# Patient Record
Sex: Male | Born: 1970 | Race: White | Hispanic: No | Marital: Married | Smoking: Never smoker
Health system: Southern US, Community
[De-identification: ages and names within clinical notes are randomized; demographics above are authoritative.]

## PROBLEM LIST (undated history)

## (undated) HISTORY — PX: APPENDECTOMY: SHX54

## (undated) HISTORY — PX: FRACTURE SURGERY: SHX138

---

## 2001-10-24 ENCOUNTER — Inpatient Hospital Stay (HOSPITAL_COMMUNITY): Admission: EM | Admit: 2001-10-24 | Discharge: 2001-10-30 | Payer: Self-pay | Admitting: Psychiatry

## 2005-02-26 ENCOUNTER — Emergency Department (HOSPITAL_COMMUNITY): Admission: EM | Admit: 2005-02-26 | Discharge: 2005-02-26 | Payer: Self-pay | Admitting: Emergency Medicine

## 2010-09-23 ENCOUNTER — Ambulatory Visit (HOSPITAL_BASED_OUTPATIENT_CLINIC_OR_DEPARTMENT_OTHER)
Admission: RE | Admit: 2010-09-23 | Discharge: 2010-09-23 | Disposition: A | Discharge status: Home / Self Care | Source: Ambulatory Visit | Attending: Orthopedic Surgery | Admitting: Orthopedic Surgery

## 2010-09-23 DIAGNOSIS — Z01812 Encounter for preprocedural laboratory examination: Secondary | ICD-10-CM | POA: Insufficient documentation

## 2010-09-23 DIAGNOSIS — IMO0002 Reserved for concepts with insufficient information to code with codable children: Secondary | ICD-10-CM | POA: Insufficient documentation

## 2010-09-23 LAB — POCT HEMOGLOBIN-HEMACUE: Hemoglobin: 17 g/dL (ref 13.0–17.0)

## 2010-10-08 ENCOUNTER — Emergency Department (HOSPITAL_COMMUNITY)

## 2010-10-08 ENCOUNTER — Emergency Department (HOSPITAL_COMMUNITY)
Admission: EM | Admit: 2010-10-08 | Discharge: 2010-10-08 | Disposition: A | Discharge status: Home / Self Care | Attending: Emergency Medicine | Admitting: Emergency Medicine

## 2010-10-08 DIAGNOSIS — R51 Headache: Secondary | ICD-10-CM | POA: Insufficient documentation

## 2010-10-08 DIAGNOSIS — R4182 Altered mental status, unspecified: Secondary | ICD-10-CM | POA: Insufficient documentation

## 2010-10-08 DIAGNOSIS — R112 Nausea with vomiting, unspecified: Secondary | ICD-10-CM | POA: Insufficient documentation

## 2010-10-08 DIAGNOSIS — R413 Other amnesia: Secondary | ICD-10-CM | POA: Insufficient documentation

## 2010-10-08 DIAGNOSIS — F4310 Post-traumatic stress disorder, unspecified: Secondary | ICD-10-CM | POA: Insufficient documentation

## 2010-10-08 LAB — COMPREHENSIVE METABOLIC PANEL
ALT: 89 U/L — ABNORMAL HIGH (ref 0–53)
AST: 81 U/L — ABNORMAL HIGH (ref 0–37)
Albumin: 4.7 g/dL (ref 3.5–5.2)
Alkaline Phosphatase: 73 U/L (ref 39–117)
Chloride: 99 mEq/L (ref 96–112)
Potassium: 4.7 mEq/L (ref 3.5–5.1)
Sodium: 134 mEq/L — ABNORMAL LOW (ref 135–145)
Total Bilirubin: 0.8 mg/dL (ref 0.3–1.2)
Total Protein: 8.2 g/dL (ref 6.0–8.3)

## 2010-10-08 LAB — DIFFERENTIAL
Basophils Absolute: 0 10*3/uL (ref 0.0–0.1)
Basophils Relative: 1 % (ref 0–1)
Eosinophils Absolute: 0.1 K/uL (ref 0.0–0.7)
Eosinophils Relative: 1 % (ref 0–5)
Lymphocytes Relative: 16 % (ref 12–46)
Lymphs Abs: 1 K/uL (ref 0.7–4.0)
Monocytes Absolute: 0.5 10*3/uL (ref 0.1–1.0)
Monocytes Relative: 7 % (ref 3–12)
Neutro Abs: 4.8 K/uL (ref 1.7–7.7)
Neutrophils Relative %: 75 % (ref 43–77)

## 2010-10-08 LAB — CBC
HCT: 46.6 % (ref 39.0–52.0)
Hemoglobin: 16.8 g/dL (ref 13.0–17.0)
MCH: 31.8 pg (ref 26.0–34.0)
MCHC: 36.1 g/dL — ABNORMAL HIGH (ref 30.0–36.0)
MCV: 88.1 fL (ref 78.0–100.0)
Platelets: 230 10*3/uL (ref 150–400)
RBC: 5.29 MIL/uL (ref 4.22–5.81)
RDW: 12.5 % (ref 11.5–15.5)
WBC: 6.4 10*3/uL (ref 4.0–10.5)

## 2010-10-08 LAB — COMPREHENSIVE METABOLIC PANEL WITH GFR
BUN: 8 mg/dL (ref 6–23)
CO2: 25 meq/L (ref 19–32)
Calcium: 10.4 mg/dL (ref 8.4–10.5)
Creatinine, Ser: 0.65 mg/dL (ref 0.50–1.35)
GFR calc Af Amer: >60 mL/min (ref >60)
GFR calc non Af Amer: >60 mL/min (ref >60)
Glucose, Bld: 90 mg/dL (ref 70–99)

## 2010-10-08 LAB — POCT I-STAT TROPONIN I: Troponin i, poc: 0 ng/mL (ref 0.00–0.08)

## 2010-10-08 LAB — RAPID URINE DRUG SCREEN, HOSP PERFORMED
Amphetamines: NOT DETECTED
Barbiturates: NOT DETECTED
Benzodiazepines: NOT DETECTED
Cocaine: NOT DETECTED
Opiates: NOT DETECTED
Tetrahydrocannabinol: NOT DETECTED

## 2010-10-08 LAB — LIPASE, BLOOD: Lipase: 34 U/L (ref 11–59)

## 2010-10-08 LAB — ETHANOL: Alcohol, Ethyl (B): <11 mg/dL (ref 0–11)

## 2010-10-08 NOTE — Procedures (Unsigned)
EEG NUMBER:  REFERRING PHYSICIAN:  Gavin Pound. Ghim, MD  HISTORY:  A 40 year old man being evaluated for sudden amnesia, EEG for evaluation.  MEDICATIONS:  None.  EEG DURATION:  Twenty-two minutes of EEG recording.  EEG DESCRIPTION:  This is a routine 16-channel adult EEG recording with one channel devoted to limited EKG recording.  Activation procedure was performed during the photic stimulation and hyperventilation.  The study is performed in awake and drowsy state.  As the EEG opens up, I noticed that the posterior dominant rhythm consist of 9 Hz frequency with an amplitude ranging from 14-29 microvolts.  The rhythm is symmetrical and continuous.  Majority of the study reflects the sleep architecture with vertex waves and synchronous spindles.  There is no electrographic seizures or epileptiform discharges recorded during the study.  There is no driving noted with the photic stimulation in the posterior lead and there is no buildup of high amplitude slowing noted with the hyperventilation either.  EEG INTERPRETATION:  This is a normal awake and sleep EEG.  There is no evidence to suggest electrographic seizures or epileptiform discharges or any lateralization of the study.          ______________________________ Levie Heritage, MD    ZO:XWRU D:  10/08/2010 15:03:00  T:  10/08/2010 22:01:13  Job #:  [id number]
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
## 2010-11-17 NOTE — Op Note (Signed)
NAME:  Joe Allison, Joe Allison                  ACCOUNT NO.:  [id number]  MEDICAL RECORD NO.:  [id number]  LOCATION:                                 FACILITY:  PHYSICIAN:  Cindee Salt, M.D.            DATE OF BIRTH:  DATE OF PROCEDURE:  09/23/2010 DATE OF DISCHARGE:                              OPERATIVE REPORT   PREOPERATIVE DIAGNOSIS:  Nascent malunion fracture right thumb metacarpal.  POSTOPERATIVE DIAGNOSIS:  Nascent malunion fracture right thumb metacarpal.  OPERATION:  Takedown of nascent malunion, open reduction and internal fixation of right thumb metacarpal.  SURGEON:  Cindee Salt, MD  ASSISTANT:  Artist Pais. Mina Marble, MD  ANESTHESIA:  Supraclavicular general.  ANESTHESIOLOGIST:  Janetta Hora. Gelene Mink, MD  HISTORY:  The patient is a 40 year old male with a history of injury to his right thumb playing football.  He has a nascent malunion of his right thumb.  The injury is approximately 4 weeks to 5 weeks' old.  This occurred while playing football in [place].  He is admitted for reduction, takedown of the nascent malunion with open reduction.  Pre, peri, and postoperative course have been discussed along with risks and complications.  He is aware there is no guarantee with the surgery, possibility of infection, recurrence injury to arteries, nerves, tendons, complete relief of symptoms, dystrophy.  Preoperative area the patient is seen.  The extremity marked by both the patient and surgeon. Antibiotic given.  DESCRIPTION OF PROCEDURE:  The patient is brought to the operating room where a supraclavicular block was carried out without difficulty.  A general anesthetic also given.  He was prepped using DuraPrep, supine position, right arm free.  A 3-minute dry time was allowed.  Time-out taken for patient procedure.  A longitudinal incision was made over the midlateral line radial aspect of the thumb metacarpal, carried down through subcutaneous tissue.  Bleeders were  electrocauterized with bipolar.  A dorsal sensory component of the radial nerve was identified. This was protected throughout the procedure.  The dissection carried down.  The periosteum incised.  The thenar musculature was then elevated off from the bone along with the dorsal periosteum and extensor tendons. These were done in one layer.  The malunion was immediately apparent with significant callus present.  With blunt and sharp dissection, this was removed.  The callus was removed.  The malunion site was identified. This was opened.  Periosteal elevators were then used to separate the 2 fracture fragments.  This was very gently done until the entire fracture was opened.  The callus was removed from the volar aspect along with the ulnar side.  Great care was taken to protect the princeps pollicis artery.  The fracture was entirely mobilized.  This was then clamped. It was found to reduce in position near anatomic.  This was maintained. X-ray image intensification revealed that the fracture was reduced in both AP and lateral directions.  Two 0.045 K-wires were then passed across the fracture site from a distal to proximal direction.  This firmly stabilized the fracture on removal of the bone clamp.  The wound was copiously irrigated with saline.  The  pins were cut and bent.  The periosteum was then closed with a running 3-0 Vicryl suture.  The subcutaneous tissue closed with interrupted 3-0 Vicryl and skin with interrupted 4-0 Vicryl Rapide sutures.  A sterile compressive dressing, dorsal palmar thumb spica splint was applied.  On deflation of the tourniquet, all fingers including his thumb immediately pinked.  He was taken to the recovery room for observation in satisfactory condition.  He will be discharged home to return to the [facility] [place] in 1 week on Percocet.          ______________________________ Cindee Salt, M.D.     GK/MEDQ  D:  09/23/2010  T:  09/24/2010   Job:  [id number]  Electronically Signed by Cindee Salt M.D. on 11/17/2010 04:37:45 PM

## 2014-08-22 ENCOUNTER — Emergency Department (HOSPITAL_COMMUNITY)
Admission: EM | Admit: 2014-08-22 | Discharge: 2014-08-22 | Disposition: A | Discharge status: Home / Self Care | Payer: Non-veteran care | Attending: Emergency Medicine | Admitting: Emergency Medicine

## 2014-08-22 ENCOUNTER — Encounter (HOSPITAL_COMMUNITY): Payer: Self-pay | Admitting: Emergency Medicine

## 2014-08-22 DIAGNOSIS — Z87891 Personal history of nicotine dependence: Secondary | ICD-10-CM | POA: Diagnosis not present

## 2014-08-22 DIAGNOSIS — Y9289 Other specified places as the place of occurrence of the external cause: Secondary | ICD-10-CM | POA: Diagnosis not present

## 2014-08-22 DIAGNOSIS — X58XXXA Exposure to other specified factors, initial encounter: Secondary | ICD-10-CM | POA: Diagnosis not present

## 2014-08-22 DIAGNOSIS — S0001XA Abrasion of scalp, initial encounter: Secondary | ICD-10-CM | POA: Diagnosis not present

## 2014-08-22 DIAGNOSIS — Y998 Other external cause status: Secondary | ICD-10-CM | POA: Insufficient documentation

## 2014-08-22 DIAGNOSIS — Y9389 Activity, other specified: Secondary | ICD-10-CM | POA: Diagnosis not present

## 2014-08-22 DIAGNOSIS — R569 Unspecified convulsions: Secondary | ICD-10-CM

## 2014-08-22 LAB — CBC WITH DIFFERENTIAL/PLATELET
BASOS ABS: 0 10*3/uL (ref 0.0–0.1)
BASOS PCT: 0 % (ref 0–1)
EOS ABS: 0.1 10*3/uL (ref 0.0–0.7)
EOS PCT: 1 % (ref 0–5)
HCT: 38.9 % — ABNORMAL LOW (ref 39.0–52.0)
Hemoglobin: 14.3 g/dL (ref 13.0–17.0)
LYMPHS ABS: 0.8 10*3/uL (ref 0.7–4.0)
Lymphocytes Relative: 10 % — ABNORMAL LOW (ref 12–46)
MCH: 36.1 pg — AB (ref 26.0–34.0)
MCHC: 36.8 g/dL — AB (ref 30.0–36.0)
MCV: 98.2 fL (ref 78.0–100.0)
Monocytes Absolute: 0.6 10*3/uL (ref 0.1–1.0)
Monocytes Relative: 8 % (ref 3–12)
NEUTROS PCT: 80 % — AB (ref 43–77)
Neutro Abs: 5.9 10*3/uL (ref 1.7–7.7)
PLATELETS: 140 10*3/uL — AB (ref 150–400)
RBC: 3.96 MIL/uL — AB (ref 4.22–5.81)
RDW: 14.6 % (ref 11.5–15.5)
WBC: 7.4 10*3/uL (ref 4.0–10.5)

## 2014-08-22 LAB — RAPID URINE DRUG SCREEN, HOSP PERFORMED
AMPHETAMINES: NOT DETECTED
BARBITURATES: NOT DETECTED
BENZODIAZEPINES: NOT DETECTED
COCAINE: NOT DETECTED
Opiates: NOT DETECTED
Tetrahydrocannabinol: NOT DETECTED

## 2014-08-22 LAB — BASIC METABOLIC PANEL
ANION GAP: 13 (ref 5–15)
BUN: 8 mg/dL (ref 6–20)
CO2: 22 mmol/L (ref 22–32)
Calcium: 9.2 mg/dL (ref 8.9–10.3)
Chloride: 95 mmol/L — ABNORMAL LOW (ref 101–111)
Creatinine, Ser: 1.17 mg/dL (ref 0.61–1.24)
Glucose, Bld: 113 mg/dL — ABNORMAL HIGH (ref 65–99)
POTASSIUM: 3.3 mmol/L — AB (ref 3.5–5.1)
SODIUM: 130 mmol/L — AB (ref 135–145)

## 2014-08-22 LAB — ETHANOL: ALCOHOL ETHYL (B): 6 mg/dL — AB (ref <5)

## 2014-08-22 MED ORDER — LORAZEPAM 1 MG PO TABS
1.0000 mg | ORAL_TABLET | Freq: Once | ORAL | Status: AC
  Administered 2014-08-22: 1 mg via ORAL
  Filled 2014-08-22: qty 1

## 2014-08-22 MED ORDER — ONDANSETRON 4 MG PO TBDP: 4.0000 mg | ORAL_TABLET | Freq: Three times a day (TID) | ORAL | Status: DC | PRN

## 2014-08-22 MED ORDER — CHLORDIAZEPOXIDE HCL 25 MG PO CAPS: ORAL_CAPSULE | ORAL | Status: DC

## 2014-08-22 NOTE — ED Provider Notes (Signed)
CSN: [id number]     Arrival date & time 08/22/14  1051 History   First MD Initiated Contact with Patient 08/22/14 1055     Chief Complaint  Patient presents with  . Seizures     (Consider location/radiation/quality/duration/timing/severity/associated sxs/prior Treatment) Patient is a 44 y.o. male presenting with seizures. The history is provided by the patient and medical records.  Seizures   This is a 44 y.o. M with no significant PMH presenting to the ED for seizure.  Patient states the last thing he remembers was sharpening a saw and had a witnessed seizure by co-workers.  States he awoke with everyone around him and had no recollection of what happened.  No hx of seizures in the past.  Denies illicit drug use.  Patient recently stopped drinking on Monday (3 days ago).  States he was drinking variable amounts of beer on a daily basis for the past several months but decided to stop because it was causing problems in his personal life.  He states he has had some nausea/vomiting over the past week as well.  Denies abdominal pain.  Has continued eating and drinking normally.  No chest pain or SOB.  Denies SI/HI/AVH. VSS.  History reviewed. No pertinent past medical history. Past Surgical History  Procedure Laterality Date  . Appendectomy    . Fracture surgery      Right Arm   Family History  Problem Relation Age of Onset  . Cancer Mother   . Cancer Father   . Hypertension Father    History  Substance Use Topics  . Smoking status: Former Smoker    Types: Cigarettes  . Smokeless tobacco: Current User    Types: Snuff  . Alcohol Use: Yes     Comment: Pt cant say amount per day. Stop date 08/19/14.     Review of Systems  Neurological: Positive for seizures.  All other systems reviewed and are negative.     Allergies  Review of patient's allergies indicates no known allergies.  Home Medications   Prior to Admission medications   Not on File   BP 133/94 mmHg  Pulse 100   Temp(Src) 98.2 F (36.8 C) (Oral)  Resp 19  SpO2 97%   Physical Exam  Constitutional: He is oriented to person, place, and time. He appears well-developed and well-nourished.  HENT:  Head: Normocephalic. Head is with abrasion.  Right Ear: Tympanic membrane and ear canal normal.  Left Ear: Tympanic membrane and ear canal normal.  Mouth/Throat: Uvula is midline, oropharynx is clear and moist and mucous membranes are normal. No trismus in the jaw.  Few small abrasions noted to top of scalp without active bleeding, no hematoma or underlying skull deformity noted; no hemotympanum, midface stable; no tongue laceration, dentition intact  Eyes: Conjunctivae and EOM are normal. Pupils are equal, round, and reactive to light.  Neck: Normal range of motion.  Cardiovascular: Normal rate, regular rhythm and normal heart sounds.   Pulmonary/Chest: Effort normal and breath sounds normal.  Abdominal: Soft. Bowel sounds are normal.  Musculoskeletal: Normal range of motion.  Neurological: He is alert and oriented to person, place, and time.  AAOx3, answering questions appropriately; equal strength UE and LE bilaterally; CN grossly intact; moves all extremities appropriately without ataxia; no focal neuro deficits or facial asymmetry appreciated  Skin: Skin is warm and dry.  Psychiatric: He has a normal mood and affect.  Nursing note and vitals reviewed.   ED Course  Procedures (including critical care time)  Labs Review Labs Reviewed  CBC WITH DIFFERENTIAL/PLATELET - Abnormal; Notable for the following:    RBC 3.96 (*)    HCT 38.9 (*)    MCH 36.1 (*)    MCHC 36.8 (*)    Platelets 140 (*)    Neutrophils Relative % 80 (*)    Lymphocytes Relative 10 (*)    All other components within normal limits  BASIC METABOLIC PANEL - Abnormal; Notable for the following:    Sodium 130 (*)    Potassium 3.3 (*)    Chloride 95 (*)    Glucose, Bld 113 (*)    All other components within normal limits  ETHANOL -  Abnormal; Notable for the following:    Alcohol, Ethyl (B) 6 (*)    All other components within normal limits  URINE RAPID DRUG SCREEN, HOSP PERFORMED    Imaging Review No results found.   EKG Interpretation   Date/Time:  Thursday August 22 2014 10:54:25 EDT Ventricular Rate:  101 PR Interval:  179 QRS Duration: 102 QT Interval:  378 QTC Calculation: 490 R Axis:   40 Text Interpretation:  Sinus tachycardia RSR' in V1 or V2, right VCD or RVH  Borderline abnrm T, anterolateral leads Borderline prolonged QT interval  Sinus tachycardia RSR' pattern in V1 T wave abnormality Abnormal ekg  Confirmed by Gerhard MunchLOCKWOOD, ROBERT  MD [phone] on 08/22/2014 11:48:08 AM      MDM   Final diagnoses:  Seizure   44 year old male here with witnessed seizure. No history of seizure disorder. Recently decided to stop drinking 3 days ago, was routinely drinking beer on a daily basis for the past several months. Suspect alcohol withdrawal seizure. Labwork is overall reassuring. Patient has had no further seizure activity here. He does appear somewhat jittery, however no tremors noted. CIWA score of approx 4 at this time.  Dose of Ativan was given.  Feel that patient is stable for OP management, will discharge home with supportive care including Librium and Zofran.  Patient will follow-up with his PCP.  Discussed plan with patient, he/she acknowledged understanding and agreed with plan of care.  Return precautions given for new or worsening symptoms.  Case discussed with attending physician, Dr. Jeraldine LootsLockwood, who evaluated patient and agrees with assessment and plan of care.  Garlon HatchetLisa M Neeka Urista, PA-C 08/22/14 1438  Gerhard Munchobert Lockwood, MD 08/22/14 [phone]

## 2014-08-22 NOTE — Discharge Instructions (Signed)
Take the prescribed medication as directed.  Make sure to drink plenty of water to stay hydrated. Follow-up with your primary care physician. Return to the ED for new or worsening symptoms.

## 2014-08-22 NOTE — ED Notes (Signed)
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 Pt denies hx of seizure. Pt A&Ox4 Pt does not remember seizure and sts the last thing he remembers was sharping a saw. Pt noted to have mild abrasion to top of head. Pt sts he recently stopped drinking Monday at 2pm. Pt sts he use to drink a lot everyday when asked amount pt sts it varied. Vitals: 141/93, 95% Rm air, 102HR, 28RR

## 2015-07-08 ENCOUNTER — Emergency Department: Payer: Worker's Compensation

## 2015-07-08 ENCOUNTER — Emergency Department
Admission: EM | Admit: 2015-07-08 | Discharge: 2015-07-08 | Disposition: A | Discharge status: Home / Self Care | Payer: Worker's Compensation | Attending: Emergency Medicine | Admitting: Emergency Medicine

## 2015-07-08 DIAGNOSIS — Y929 Unspecified place or not applicable: Secondary | ICD-10-CM | POA: Insufficient documentation

## 2015-07-08 DIAGNOSIS — Y990 Civilian activity done for income or pay: Secondary | ICD-10-CM | POA: Diagnosis not present

## 2015-07-08 DIAGNOSIS — Z79899 Other long term (current) drug therapy: Secondary | ICD-10-CM | POA: Insufficient documentation

## 2015-07-08 DIAGNOSIS — S8992XA Unspecified injury of left lower leg, initial encounter: Secondary | ICD-10-CM | POA: Diagnosis present

## 2015-07-08 DIAGNOSIS — S82425A Nondisplaced transverse fracture of shaft of left fibula, initial encounter for closed fracture: Secondary | ICD-10-CM | POA: Insufficient documentation

## 2015-07-08 DIAGNOSIS — S82402A Unspecified fracture of shaft of left fibula, initial encounter for closed fracture: Secondary | ICD-10-CM

## 2015-07-08 DIAGNOSIS — F17290 Nicotine dependence, other tobacco product, uncomplicated: Secondary | ICD-10-CM | POA: Diagnosis not present

## 2015-07-08 DIAGNOSIS — Y939 Activity, unspecified: Secondary | ICD-10-CM | POA: Diagnosis not present

## 2015-07-08 DIAGNOSIS — W228XXA Striking against or struck by other objects, initial encounter: Secondary | ICD-10-CM | POA: Insufficient documentation

## 2015-07-08 MED ORDER — HYDROCODONE-ACETAMINOPHEN 5-325 MG PO TABS: 1.0000 | ORAL_TABLET | ORAL | Status: DC | PRN

## 2015-07-08 MED ORDER — TRAMADOL HCL 50 MG PO TABS: 50.0000 mg | ORAL_TABLET | Freq: Four times a day (QID) | ORAL | Status: AC | PRN

## 2015-07-08 NOTE — ED Notes (Signed)
See triage note  States he had a log rolled onto left lower leg  No redness or deformity noted

## 2015-07-08 NOTE — ED Provider Notes (Addendum)
[facility] Emergency Department Provider Note  ____________________________________________  Time seen: Approximately 2:00 PM  I have reviewed the triage vital signs and the nursing notes.   HISTORY  Chief Complaint Leg Pain    HPI Joe Allison is a 45 y.o. male presents for evaluation of left lower leg pain. Patient states that a log rolled onto his leg today at work.   History reviewed. No pertinent past medical history.  There are no active problems to display for this patient.   Past Surgical History  Procedure Laterality Date  . Appendectomy    . Fracture surgery      Right Arm    Current Outpatient Rx  Name  Route  Sig  Dispense  Refill  . chlordiazePOXIDE (LIBRIUM) 25 MG capsule      50mg  PO TID x 1D, then 25-50mg  PO BID X 1D, then 25-50mg  PO QD X 1D   10 capsule   0   . ondansetron (ZOFRAN ODT) 4 MG disintegrating tablet   Oral   Take 1 tablet (4 mg total) by mouth every 8 (eight) hours as needed for nausea.   10 tablet   0   . traMADol (ULTRAM) 50 MG tablet   Oral   Take 1 tablet (50 mg total) by mouth every 6 (six) hours as needed for moderate pain.   20 tablet   0     Allergies Review of patient's allergies indicates no known allergies.  Family History  Problem Relation Age of Onset  . Cancer Mother   . Cancer Father   . Hypertension Father     Social History Social History  Substance Use Topics  . Smoking status: Former Smoker    Types: Cigarettes  . Smokeless tobacco: Current User    Types: Snuff  . Alcohol Use: Yes     Comment: Pt cant say amount per day. Stop date 08/19/14.     Review of Systems Constitutional: No fever/chills Eyes: No visual changes. ENT: No sore throat. Cardiovascular: Denies chest pain. Respiratory: Denies shortness of breath. Gastrointestinal: No abdominal pain.  No nausea, no vomiting.  No diarrhea.  No constipation. Genitourinary: Negative for dysuria. Musculoskeletal:  Positive for left lower leg pain. Skin: Negative for rash. Neurological: Negative for headaches, focal weakness or numbness.  10-point ROS otherwise negative.  ____________________________________________   PHYSICAL EXAM:  VITAL SIGNS: ED Triage Vitals  Enc Vitals Group     BP 07/08/15 1343 134/86 mmHg     Pulse Rate 07/08/15 1343 72     Resp 07/08/15 1343 18     Temp 07/08/15 1343 98.8 F (37.1 C)     Temp Source 07/08/15 1343 Oral     SpO2 07/08/15 1343 98 %     Weight 07/08/15 1343 175 lb (79.379 kg)     Height 07/08/15 1343 5\' 8"  (1.727 m)     Head Cir --      Peak Flow --      Pain Score 07/08/15 1343 9     Pain Loc --      Pain Edu? --      Excl. in GC? --     Constitutional: Alert and oriented. Well appearing and in no acute distress. Eyes: Conjunctivae are normal. PERRL. EOMI. Head: Atraumatic. Nose: No congestion/rhinnorhea. Mouth/Throat: Mucous membranes are moist.  Oropharynx non-erythematous. Neck: No stridor.   Cardiovascular: Normal rate, regular rhythm. Grossly normal heart sounds.  Good peripheral circulation. Respiratory: Normal respiratory effort.  No  retractions. Lungs CTAB. Gastrointestinal: Soft and nontender. No distention. No abdominal bruits. No CVA tenderness. Musculoskeletal: Positive left lower leg pain positive edema. Point tenderness noted within the mid tib-fib area. Neurologic:  Normal speech and language. No gross focal neurologic deficits are appreciated. No gait instability. Skin:  Skin is warm, dry and intact. No rash noted. Psychiatric: Mood and affect are normal. Speech and behavior are normal.  ____________________________________________   LABS (all labs ordered are listed, but only abnormal results are displayed)  Labs Reviewed - No data to display ____________________________________________  RADIOLOGY  Positive for fibula fracture. ____________________________________________   PROCEDURES  Procedure(s) performed:  None  Critical Care performed: No  ____________________________________________   INITIAL IMPRESSION / ASSESSMENT AND PLAN / ED COURSE  Pertinent labs & imaging results that were available during my care of the patient were reviewed by me and considered in my medical decision making (see chart for details).  Nondisplaced fibula fracture. Patient was given prescription for tramadol place a knee immobilizer and crutches and follow-up with Dr. Hyacinth Meeker this week as needed. Work excuse 48 hours given. Patient voices no other emergency medical complaints at this time. ____________________________________________   FINAL CLINICAL IMPRESSION(S) / ED DIAGNOSES  Final diagnoses:  Closed fibular fracture, left, initial encounter     This chart was dictated using voice recognition software/Dragon. Despite best efforts to proofread, errors can occur which can change the meaning. Any change was purely unintentional.   Evangeline Dakin, PA-C 07/08/15 1546  Jene Every, MD 07/08/15 1635  Charmayne Sheer Beers, PA-C 07/22/15 1732  Jene Every, MD 07/22/15 [phone]

## 2015-07-08 NOTE — Discharge Instructions (Signed)
Fibular Ankle Fracture Treated With or Without Immobilization, Adult °A fibular fracture at your ankle is a break (fracture) bone in the smallest of the two bones in your lower leg, located on the outside of your leg (fibula) close to the area at your ankle joint. °CAUSES °· Rolling your ankle. °· Twisting your ankle. °· Extreme flexing or extending of your foot. °· Severe force on your ankle as when falling from a distance. °RISK FACTORS °· Jumping activities. °· Participation in sports. °· Osteoporosis. °· Advanced age. °· Previous ankle injuries. °SIGNS AND SYMPTOMS °· Pain. °· Swelling. °· Inability to put weight on injured ankle. °· Bruising. °· Bone deformities at site of injury. °DIAGNOSIS  °This fracture is diagnosed with the help of an X-ray exam. °TREATMENT  °If the fractured bone did not move out of place it usually will heal without problems and does casting or splinting. If immobilization is needed for comfort or the fractured bone moved out of place and will not heal properly with immobilization, a cast or splint will be used. °HOME CARE INSTRUCTIONS  °· Apply ice to the area of injury: °¨ Put ice in a plastic bag. °¨ Place a towel between your skin and the bag. °¨ Leave the ice on for 20 minutes, 2-3 times a day. °· Use crutches as directed. Resume walking without crutches as directed by your health care provider. °· Only take over-the-counter or prescription medicines for pain, discomfort, or fever as directed by your health care provider. °· If you have a removable splint or boot, do not remove the boot unless directed by your health care provider. °SEEK MEDICAL CARE IF:  °· You have continued pain or more swelling °· The medications do not control the pain. °SEEK IMMEDIATE MEDICAL CARE IF: °· You develop severe pain in the leg or foot. °· Your skin or nails below the injury turn blue or grey or feel cold or numb. °MAKE SURE YOU:  °· Understand these instructions. °· Will watch your  condition. °· Will get help right away if you are not doing well or get worse. °  °This information is not intended to replace advice given to you by your health care provider. Make sure you discuss any questions you have with your health care provider. °  °Document Released: 02/01/2005 Document Revised: 02/22/2014 Document Reviewed: 09/13/2012 °Elsevier Interactive Patient Education ©2016 [employer] ° °

## 2015-07-08 NOTE — ED Notes (Signed)
Pt reports to ED w/ c/o L leg pain that began today r/t injury.  Pt sts that log rolled onto leg while he was at work.  Pain w/ movement of ankle, no swelling or abrasions noted. WC.  Sensation and pulses intact. NAD.

## 2015-09-27 ENCOUNTER — Emergency Department (HOSPITAL_COMMUNITY)
Admission: EM | Admit: 2015-09-27 | Discharge: 2015-09-27 | Disposition: A | Discharge status: Home / Self Care | Payer: Non-veteran care | Attending: Emergency Medicine | Admitting: Emergency Medicine

## 2015-09-27 ENCOUNTER — Encounter (HOSPITAL_COMMUNITY): Payer: Self-pay | Admitting: Emergency Medicine

## 2015-09-27 DIAGNOSIS — Z79899 Other long term (current) drug therapy: Secondary | ICD-10-CM | POA: Diagnosis not present

## 2015-09-27 DIAGNOSIS — Z87891 Personal history of nicotine dependence: Secondary | ICD-10-CM | POA: Insufficient documentation

## 2015-09-27 DIAGNOSIS — F1010 Alcohol abuse, uncomplicated: Secondary | ICD-10-CM | POA: Diagnosis not present

## 2015-09-27 LAB — RAPID URINE DRUG SCREEN, HOSP PERFORMED
Amphetamines: NOT DETECTED
BARBITURATES: NOT DETECTED
Benzodiazepines: NOT DETECTED
Cocaine: NOT DETECTED
OPIATES: NOT DETECTED
TETRAHYDROCANNABINOL: NOT DETECTED

## 2015-09-27 LAB — COMPREHENSIVE METABOLIC PANEL
ALT: 108 U/L — ABNORMAL HIGH (ref 17–63)
AST: 162 U/L — AB (ref 15–41)
Albumin: 4.9 g/dL (ref 3.5–5.0)
Alkaline Phosphatase: 90 U/L (ref 38–126)
Anion gap: 12 (ref 5–15)
BILIRUBIN TOTAL: 0.7 mg/dL (ref 0.3–1.2)
CO2: 23 mmol/L (ref 22–32)
Calcium: 9.5 mg/dL (ref 8.9–10.3)
Chloride: 90 mmol/L — ABNORMAL LOW (ref 101–111)
Creatinine, Ser: 0.64 mg/dL (ref 0.61–1.24)
GFR calc Af Amer: >60 mL/min (ref >60)
GFR calc non Af Amer: >60 mL/min (ref >60)
GLUCOSE: 114 mg/dL — AB (ref 65–99)
POTASSIUM: 3.8 mmol/L (ref 3.5–5.1)
Sodium: 125 mmol/L — ABNORMAL LOW (ref 135–145)
TOTAL PROTEIN: 8.5 g/dL — AB (ref 6.5–8.1)

## 2015-09-27 LAB — CBC
HEMATOCRIT: 45.2 % (ref 39.0–52.0)
HEMOGLOBIN: 16.4 g/dL (ref 13.0–17.0)
MCH: 33.5 pg (ref 26.0–34.0)
MCHC: 36.3 g/dL — AB (ref 30.0–36.0)
MCV: 92.2 fL (ref 78.0–100.0)
Platelets: 207 10*3/uL (ref 150–400)
RBC: 4.9 MIL/uL (ref 4.22–5.81)
RDW: 12.3 % (ref 11.5–15.5)
WBC: 5.8 10*3/uL (ref 4.0–10.5)

## 2015-09-27 LAB — ETHANOL: Alcohol, Ethyl (B): 379 mg/dL (ref <5)

## 2015-09-27 MED ORDER — CHLORDIAZEPOXIDE HCL 25 MG PO CAPS: ORAL_CAPSULE | ORAL | 0 refills | Status: AC

## 2015-09-27 MED ORDER — ONDANSETRON 4 MG PO TBDP: 4.0000 mg | ORAL_TABLET | Freq: Three times a day (TID) | ORAL | 0 refills | Status: AC | PRN

## 2015-09-27 NOTE — Discharge Instructions (Signed)
Take the prescribed medication as directed.  This will help with withdrawal symptoms. Follow-up with one of the outpatient rehabilitation facilities-- call to check for bed availability. Return to the ED for new or worsening symptoms-- seizures, worsening symptoms, suicidal or homicidal ideation, hallucinations, etc.

## 2015-09-27 NOTE — ED Triage Notes (Signed)
Pt. Stated, Im trying to check in somewhere to detox from alcohol and I know I will need medical clearance.

## 2015-09-27 NOTE — ED Notes (Signed)
PA Lisa at the bedside.  

## 2015-09-27 NOTE — ED Notes (Signed)
Pt in room, getting undressed into gown. Wife at bedside

## 2015-09-27 NOTE — ED Notes (Signed)
Spoke with wife about patient's care. Pt to go to outpatient facility starting today.

## 2015-09-27 NOTE — ED Provider Notes (Signed)
[facility] Provider Note   CSN: [id number] Arrival date & time: 09/27/15  1315  First Provider Contact:  First MD Initiated Contact with Patient 09/27/15 1406        History   Chief Complaint Chief Complaint  Patient presents with  . Medical Clearance  . Alcohol Problem    HPI Joe Allison is a 45 y.o. male.  The history is provided by the patient, a significant other and medical records.  Alcohol Problem   45 year old male with history of PTSD and anxiety, presenting to the ED requesting detox from alcohol. He reports he has been drinking heavily for the past several years. He estimates he drinks anywhere from 12-24 beers daily. Last drink a few hours ago.  He does not drink wine or liquor. He denies any illicit drug use. States he did attend a rehabilitation program several years ago but began drinking shortly after. He has also attempted to detox at home on his own several times and won't do well for a few days but begins drinking again. He denies any history of DTs. Wife is present at bedside and has concern for him and his overall health. [facility] could not get [facility] initial [facility] until late September. She has also tried to call a nurse out reach program to help facilitate a detox plan, however will could never get a call back. They decided to come to the ED today as they cannot wait any longer to get him treatment.  Denies any physical complaints at this time-- specifically no chest pain, SOB, abdominal pain, nausea, vomiting, diarrhea, headache, or dizziness.  VSS.  History reviewed. No pertinent past medical history.  There are no active problems to display for this patient.   Past Surgical History:  Procedure Laterality Date  . APPENDECTOMY    . FRACTURE SURGERY     Right Arm       Home Medications    Prior to Admission medications   Medication Sig Start Date End Date Taking? Authorizing Provider   chlordiazePOXIDE (LIBRIUM) 25 MG capsule 50mg  PO TID x 1D, then 25-50mg  PO BID X 1D, then 25-50mg  PO QD X 1D 08/22/14   Garlon Hatchet, PA-C  ondansetron (ZOFRAN ODT) 4 MG disintegrating tablet Take 1 tablet (4 mg total) by mouth every 8 (eight) hours as needed for nausea. 08/22/14   Garlon Hatchet, PA-C  traMADol (ULTRAM) 50 MG tablet Take 1 tablet (50 mg total) by mouth every 6 (six) hours as needed for moderate pain. Patient not taking: Reported on 09/27/2015 07/08/15 07/07/16  Evangeline Dakin, PA-C    Family History Family History  Problem Relation Age of Onset  . Cancer Mother   . Cancer Father   . Hypertension Father     Social History Social History  Substance Use Topics  . Smoking status: Former Smoker    Types: Cigarettes  . Smokeless tobacco: Current User    Types: Snuff  . Alcohol use Yes     Comment: Pt cant say amount per day. Stop date 08/19/14.      Allergies   Review of patient's allergies indicates no known allergies.   Review of Systems Review of Systems  Psychiatric/Behavioral:       Wants detox  All other systems reviewed and are negative.    Physical Exam Updated Vital Signs BP (!) 158/111 (BP Location: Left Arm)   Pulse 72   Temp 98.8  F (37.1 C) (Oral)   Resp 17   Ht 5\' 8"  (1.727 m)   Wt 75.8 kg   SpO2 100%   BMI 25.41 kg/m   Physical Exam  Constitutional: He is oriented to person, place, and time. He appears well-developed and well-nourished.  HENT:  Head: Normocephalic and atraumatic.  Mouth/Throat: Oropharynx is clear and moist.  Eyes: Conjunctivae and EOM are normal. Pupils are equal, round, and reactive to light.  Neck: Normal range of motion.  Cardiovascular: Normal rate, regular rhythm and normal heart sounds.   Pulmonary/Chest: Effort normal and breath sounds normal.  Abdominal: Soft. Bowel sounds are normal.  Musculoskeletal: Normal range of motion.  Neurological: He is alert and oriented to person, place, and time.  Awake,  alert, fully oriented to baseline, moving extremities well, normal gait, speech coherent and goal oriented  Skin: Skin is warm and dry.  Psychiatric: He has a normal mood and affect.  Nursing note and vitals reviewed.    ED Treatments / Results  Labs (all labs ordered are listed, but only abnormal results are displayed) Labs Reviewed  COMPREHENSIVE METABOLIC PANEL - Abnormal; Notable for the following:       Result Value   Sodium 125 (*)    Chloride 90 (*)    Glucose, Bld 114 (*)    BUN <5 (*)    Total Protein 8.5 (*)    AST 162 (*)    ALT 108 (*)    All other components within normal limits  ETHANOL - Abnormal; Notable for the following:    Alcohol, Ethyl (B) 379 (*)    All other components within normal limits  CBC - Abnormal; Notable for the following:    MCHC 36.3 (*)    All other components within normal limits  URINE RAPID DRUG SCREEN, HOSP PERFORMED    EKG  EKG Interpretation None       Radiology No results found.  Procedures Procedures (including critical care time)  Medications Ordered in ED Medications - No data to display   Initial Impression / Assessment and Plan / ED Course  I have reviewed the triage vital signs and the nursing notes.  Pertinent labs & imaging results that were available during my care of the patient were reviewed by me and considered in my medical decision making (see chart for details).  Clinical Course   45 year old male here requesting detox. He and wife are aware that we no longer offer this option here in the emergency department but are inquiring about other programs in the area.  Patient is awake, alert, oriented to baseline. Neurologically he is intact. He is not displaying any clinical signs of withdrawal at this time (no tremors, seizure activity, diaphoresis, N/V).  His screening labs are overall reassuring he does have some hyponatremia which is likely secondary to dehydration. His liver enzymes are elevated, likely due  to his alcohol use. His ethanol is 379 here today, however does not appear clinically intoxicated at this time.  Patient denies any suicidal or homicidal ideation. He is not having any active hallucinations. He does not appear to be a danger to himself or others at this time.  Feel patient can be medically cleared for an outside detox. Will start on Librium taper and given prescription for Zofran.  Wife present at bedside, will take patient home and try to arrange placement-- given resource guides to help with this.  Discussed plan with patient and wife, they both acknowledged understanding and agreed with  plan of care.  Return precautions given for new or worsening symptoms.  Final Clinical Impressions(s) / ED Diagnoses   Final diagnoses:  Alcohol abuse    New Prescriptions New Prescriptions   CHLORDIAZEPOXIDE (LIBRIUM) 25 MG CAPSULE     PO TID x 1D, then 25-50mg  PO BID X 1D, then 25-50mg  PO QD X 1D   ONDANSETRON (ZOFRAN ODT) 4 MG DISINTEGRATING TABLET    Take 1 tablet (4 mg total) by mouth every 8 (eight) hours as needed for nausea.     Garlon Hatchet, PA-C 09/27/15 1445    Garlon Hatchet, PA-C 09/27/15 1456    Garlon Hatchet, PA-C 09/27/15 1457    Jacalyn Lefevre, MD 09/27/15 1515

## 2015-09-27 NOTE — ED Triage Notes (Signed)
Last alcohol drank 5 minutes ago, 8 oz,

## 2016-02-12 ENCOUNTER — Emergency Department (HOSPITAL_COMMUNITY): Payer: Non-veteran care

## 2016-02-12 ENCOUNTER — Encounter (HOSPITAL_COMMUNITY): Payer: Self-pay | Admitting: Emergency Medicine

## 2016-02-12 ENCOUNTER — Emergency Department (HOSPITAL_COMMUNITY)
Admission: EM | Admit: 2016-02-12 | Discharge: 2016-02-12 | Disposition: A | Discharge status: Home / Self Care | Payer: Non-veteran care | Attending: Emergency Medicine | Admitting: Emergency Medicine

## 2016-02-12 DIAGNOSIS — T1490XA Injury, unspecified, initial encounter: Secondary | ICD-10-CM

## 2016-02-12 DIAGNOSIS — S060X1A Concussion with loss of consciousness of 30 minutes or less, initial encounter: Secondary | ICD-10-CM | POA: Diagnosis not present

## 2016-02-12 DIAGNOSIS — Y999 Unspecified external cause status: Secondary | ICD-10-CM | POA: Diagnosis not present

## 2016-02-12 DIAGNOSIS — R1030 Lower abdominal pain, unspecified: Secondary | ICD-10-CM | POA: Diagnosis not present

## 2016-02-12 DIAGNOSIS — S0990XA Unspecified injury of head, initial encounter: Secondary | ICD-10-CM | POA: Diagnosis present

## 2016-02-12 DIAGNOSIS — S8002XA Contusion of left knee, initial encounter: Secondary | ICD-10-CM | POA: Diagnosis not present

## 2016-02-12 DIAGNOSIS — Y929 Unspecified place or not applicable: Secondary | ICD-10-CM | POA: Insufficient documentation

## 2016-02-12 DIAGNOSIS — R52 Pain, unspecified: Secondary | ICD-10-CM

## 2016-02-12 DIAGNOSIS — W132XXA Fall from, out of or through roof, initial encounter: Secondary | ICD-10-CM | POA: Diagnosis not present

## 2016-02-12 DIAGNOSIS — Y939 Activity, unspecified: Secondary | ICD-10-CM | POA: Diagnosis not present

## 2016-02-12 LAB — I-STAT CG4 LACTIC ACID, ED: Lactic Acid, Venous: 1.95 mmol/L (ref 0.5–1.9)

## 2016-02-12 LAB — I-STAT CHEM 8, ED
BUN: 17 mg/dL (ref 6–20)
CALCIUM ION: 1.09 mmol/L — AB (ref 1.15–1.40)
CHLORIDE: 100 mmol/L — AB (ref 101–111)
Creatinine, Ser: 1 mg/dL (ref 0.61–1.24)
GLUCOSE: 97 mg/dL (ref 65–99)
HCT: 43 % (ref 39.0–52.0)
Hemoglobin: 14.6 g/dL (ref 13.0–17.0)
POTASSIUM: 5.3 mmol/L — AB (ref 3.5–5.1)
Sodium: 131 mmol/L — ABNORMAL LOW (ref 135–145)
TCO2: 20 mmol/L (ref 0–100)

## 2016-02-12 LAB — URINALYSIS, ROUTINE W REFLEX MICROSCOPIC
BILIRUBIN URINE: NEGATIVE
Glucose, UA: NEGATIVE mg/dL
HGB URINE DIPSTICK: NEGATIVE
KETONES UR: NEGATIVE mg/dL
Leukocytes, UA: NEGATIVE
NITRITE: NEGATIVE
PH: 6 (ref 5.0–8.0)
Protein, ur: NEGATIVE mg/dL
Specific Gravity, Urine: 1.006 (ref 1.005–1.030)

## 2016-02-12 LAB — COMPREHENSIVE METABOLIC PANEL
ALBUMIN: 4.4 g/dL (ref 3.5–5.0)
ALK PHOS: 79 U/L (ref 38–126)
ALT: 24 U/L (ref 17–63)
ANION GAP: 11 (ref 5–15)
AST: 32 U/L (ref 15–41)
BILIRUBIN TOTAL: 0.7 mg/dL (ref 0.3–1.2)
BUN: 11 mg/dL (ref 6–20)
CALCIUM: 9.9 mg/dL (ref 8.9–10.3)
CO2: 20 mmol/L — AB (ref 22–32)
CREATININE: 0.94 mg/dL (ref 0.61–1.24)
Chloride: 101 mmol/L (ref 101–111)
GFR calc non Af Amer: >60 mL/min (ref >60)
GLUCOSE: 91 mg/dL (ref 65–99)
Potassium: 3.8 mmol/L (ref 3.5–5.1)
Sodium: 132 mmol/L — ABNORMAL LOW (ref 135–145)
TOTAL PROTEIN: 7.8 g/dL (ref 6.5–8.1)

## 2016-02-12 LAB — CDS SEROLOGY

## 2016-02-12 LAB — PROTIME-INR
INR: 0.94
PROTHROMBIN TIME: 12.6 s (ref 11.4–15.2)

## 2016-02-12 LAB — SAMPLE TO BLOOD BANK

## 2016-02-12 LAB — ETHANOL: Alcohol, Ethyl (B): <5 mg/dL (ref <5)

## 2016-02-12 MED ORDER — TRAMADOL HCL 50 MG PO TABS: 50.0000 mg | ORAL_TABLET | Freq: Four times a day (QID) | ORAL | 0 refills | Status: AC | PRN

## 2016-02-12 MED ORDER — ONDANSETRON HCL 4 MG/2ML IJ SOLN
INTRAMUSCULAR | Status: AC
  Filled 2016-02-12: qty 2

## 2016-02-12 MED ORDER — SODIUM CHLORIDE 0.9 % IV BOLUS (SEPSIS)
1000.0000 mL | Freq: Once | INTRAVENOUS | Status: AC
  Administered 2016-02-12: 1000 mL via INTRAVENOUS

## 2016-02-12 MED ORDER — HYDROCODONE-ACETAMINOPHEN 5-325 MG PO TABS: 2.0000 | ORAL_TABLET | ORAL | 0 refills | Status: DC | PRN

## 2016-02-12 MED ORDER — ONDANSETRON HCL 4 MG/2ML IJ SOLN
4.0000 mg | Freq: Once | INTRAMUSCULAR | Status: AC
  Administered 2016-02-12: 4 mg via INTRAVENOUS

## 2016-02-12 MED ORDER — IOPAMIDOL (ISOVUE-300) INJECTION 61%
INTRAVENOUS | Status: AC
  Administered 2016-02-12: 100 mL
  Filled 2016-02-12: qty 100

## 2016-02-12 MED ORDER — MORPHINE SULFATE (PF) 4 MG/ML IV SOLN
4.0000 mg | Freq: Once | INTRAVENOUS | Status: AC
  Administered 2016-02-12: 4 mg via INTRAVENOUS
  Filled 2016-02-12: qty 1

## 2016-02-12 MED ORDER — SODIUM CHLORIDE 0.9 % IV SOLN: INTRAVENOUS | Status: DC

## 2016-02-12 NOTE — ED Provider Notes (Signed)
Pt seen and evaluated on arrival with Dr.R.Dong.  Fell 10-2 feet from house after being swept from roof by falling tree. +LOC less than 1 min per coworkers. Mbulated to porch. Collared by EMTs.  Neg fast x ?slight pelvic fluid.  (-) AP Ct. Normal aside on chest x-ray. Initially some difficulty with orientation to time. His lucid now although he states he feels "funny". Alert oriented and conversant. Left knee films show no fracture or joint effusion. Signed living will place on crutches. States has crutches at home. [facility] [facility] as [place]. Stable for discharge. Given post concussion precautions to patient and wife. Limited amount of pain medication on the day then ice Motrin and Tylenol.   Joe PorterMark Orvella Digiulio, MD 02/12/16 1435

## 2016-02-12 NOTE — ED Notes (Signed)
Removed pt's IV, per Clydie BraunKaren - RN.

## 2016-02-12 NOTE — ED Triage Notes (Signed)
See trauma notes

## 2016-02-12 NOTE — ED Notes (Signed)
Lactic Acid results reported to Dr.James 

## 2016-02-12 NOTE — Discharge Instructions (Signed)
Slowly increase weightbearing as tolerated on your left knee.  Although with Dr. Lajoyce Cornersuda if your pain persist greater than 1 week.

## 2016-02-12 NOTE — ED Provider Notes (Signed)
[facility] Provider Note   CSN: [id number] Arrival date & time: 02/12/16  1218     History   Chief Complaint Chief Complaint  Patient presents with  . level 2 trauma  . Fall    HPI Joe Allison is a 45 y.o. male.  Patient was sitting down trees outside and standing on the roof when a branch fell and caught his shirt pulling them off the roof. Patient fell proximal 12 feet onto his left side. Patient had positive loss consciousness and was confused when he woke up.   The history is provided by the EMS personnel and the patient.  Trauma Mechanism of injury: fall Injury location: leg Injury location detail: L knee Incident location: outdoors Time since incident: 1 hour Arrived directly from scene: yes   Fall:      Fall occurred: from a roof      Height of fall: 12      Point of impact: L side of body.  Protective equipment:       None      Suspicion of alcohol use: no      Suspicion of drug use: no  EMS/PTA data:      Responsiveness: alert      Oriented to: person and place      Loss of consciousness: yes      Loss of consciousness duration: 1 minute      Amnesic to event: yes  Current symptoms:      Associated symptoms:            Reports headache and loss of consciousness.            Denies abdominal pain, back pain, chest pain, nausea and neck pain.    History reviewed. No pertinent past medical history.  There are no active problems to display for this patient.   History reviewed. No pertinent surgical history.     Home Medications    Prior to Admission medications   Medication Sig Start Date End Date Taking? Authorizing Provider  HYDROcodone-acetaminophen (NORCO/VICODIN) 5-325 MG tablet Take 2 tablets by mouth every 4 (four) hours as needed. 02/12/16   Rolland Porter, MD    Family History No family history on file.  Social History Social History  Substance Use Topics  . Smoking status: Never Smoker  . Smokeless tobacco: Never Used    . Alcohol use No     Allergies   Patient has no known allergies.   Review of Systems Review of Systems  Constitutional: Negative for chills and fever.  HENT: Negative.   Respiratory: Negative for shortness of breath.   Cardiovascular: Negative for chest pain.  Gastrointestinal: Negative for abdominal pain and nausea.  Musculoskeletal: Negative for back pain and neck pain.  Skin: Negative for wound.  Neurological: Positive for loss of consciousness and headaches. Negative for weakness and numbness.  All other systems reviewed and are negative.    Physical Exam Updated Vital Signs BP (!) 152/108 (BP Location: Left Arm)   Pulse 61   Temp 98.1 F (36.7 C) (Oral)   Resp 18   Ht 5\' 8"  (1.727 m)   Wt 77.1 kg   SpO2 99%   BMI 25.85 kg/m   Physical Exam  Constitutional: He appears well-developed and well-nourished. No distress.  HENT:  Head: Normocephalic and atraumatic. Head is without raccoon's eyes, without Battle's sign, without contusion and without laceration.  Mouth/Throat: Oropharynx is clear and moist.  Eyes: Conjunctivae and EOM are normal.  Pupils are equal, round, and reactive to light.  Neck: Neck supple.  Cardiovascular: Normal rate, regular rhythm, normal heart sounds and intact distal pulses.   No murmur heard. Pulmonary/Chest: Effort normal and breath sounds normal. No respiratory distress. He has no wheezes. He has no rales.  Abdominal: Soft. There is no tenderness. There is no guarding.  Musculoskeletal: He exhibits no edema or deformity.       Left knee: Tenderness (anterior tibia. ) found.       Legs: Neurological: He is alert.  Skin: Skin is warm and dry. He is not diaphoretic.  Psychiatric: He has a normal mood and affect.  Nursing note and vitals reviewed.    ED Treatments / Results  Labs (all labs ordered are listed, but only abnormal results are displayed) Labs Reviewed  COMPREHENSIVE METABOLIC PANEL - Abnormal; Notable for the following:        Result Value   Sodium 132 (*)    CO2 20 (*)    All other components within normal limits  URINALYSIS, ROUTINE W REFLEX MICROSCOPIC - Abnormal; Notable for the following:    Color, Urine COLORLESS (*)    All other components within normal limits  I-STAT CHEM 8, ED - Abnormal; Notable for the following:    Sodium 131 (*)    Potassium 5.3 (*)    Chloride 100 (*)    Calcium, Ion 1.09 (*)    All other components within normal limits  I-STAT CG4 LACTIC ACID, ED - Abnormal; Notable for the following:    Lactic Acid, Venous 1.95 (*)    All other components within normal limits  CDS SEROLOGY  ETHANOL  PROTIME-INR  SAMPLE TO BLOOD BANK    EKG  EKG Interpretation None       Radiology Dg Chest 1 View  Result Date: 02/12/2016 CLINICAL DATA:  Patient fell from tree today. CP and pain all over left knee. EXAM: CHEST 1 VIEW COMPARISON:  None. FINDINGS: The heart size and mediastinal contours are within normal limits. Both lungs are clear. No pleural effusion or pneumothorax. The visualized skeletal structures are unremarkable. No fracture is seen. IMPRESSION: No active disease. Electronically Signed   By: Amie Portland M.D.   On: 02/12/2016 13:59   Dg Knee 1-2 Views Left  Result Date: 02/12/2016 CLINICAL DATA:  Patient fell from tree today. CP and pain all over left knee. EXAM: LEFT KNEE - 1-2 VIEW COMPARISON:  None. FINDINGS: No evidence of fracture, dislocation, or joint effusion. No evidence of arthropathy or other focal bone abnormality. Soft tissues are unremarkable. IMPRESSION: Negative. Electronically Signed   By: Amie Portland M.D.   On: 02/12/2016 14:00   Ct Head Wo Contrast  Result Date: 02/12/2016 CLINICAL DATA:  Larey Seat from roof. Loss of consciousness. Headache. No pain in neck is reported. EXAM: CT HEAD WITHOUT CONTRAST CT CERVICAL SPINE WITHOUT CONTRAST TECHNIQUE: Multidetector CT imaging of the head and cervical spine was performed following the standard protocol without  intravenous contrast. Multiplanar CT image reconstructions of the cervical spine were also generated. COMPARISON:  None. FINDINGS: CT HEAD FINDINGS Brain: No evidence of acute infarction, hemorrhage, hydrocephalus, extra-axial collection or mass lesion/mass effect. Normal for age cerebral volume. No white matter disease. Vascular: No hyperdense vessel or unexpected calcification. Skull: Normal. Negative for fracture or focal lesion. Sinuses/Orbits: No acute finding. There is a chronic medial blowout fracture of the RIGHT orbit. There is a small osteoma in the LEFT anterior frontal ethmoid region Other: No layering  mastoid fluid. CT CERVICAL SPINE FINDINGS Alignment: Normal. Skull base and vertebrae: No acute fracture. No primary bone lesion or focal pathologic process. Soft tissues and spinal canal: No prevertebral fluid or swelling. No visible canal hematoma. Disc levels:  No visible disc protrusion or spinal stenosis. Upper chest: No pneumothorax.  No upper rib fracture is seen. Other: None. IMPRESSION: No skull fracture or intracranial hemorrhage. No cervical spine fracture or traumatic subluxation. Electronically Signed   By: Elsie StainJohn T Curnes M.D.   On: 02/12/2016 13:49   Ct Cervical Spine Wo Contrast  Result Date: 02/12/2016 CLINICAL DATA:  Larey SeatFell from roof. Loss of consciousness. Headache. No pain in neck is reported. EXAM: CT HEAD WITHOUT CONTRAST CT CERVICAL SPINE WITHOUT CONTRAST TECHNIQUE: Multidetector CT imaging of the head and cervical spine was performed following the standard protocol without intravenous contrast. Multiplanar CT image reconstructions of the cervical spine were also generated. COMPARISON:  None. FINDINGS: CT HEAD FINDINGS Brain: No evidence of acute infarction, hemorrhage, hydrocephalus, extra-axial collection or mass lesion/mass effect. Normal for age cerebral volume. No white matter disease. Vascular: No hyperdense vessel or unexpected calcification. Skull: Normal. Negative for  fracture or focal lesion. Sinuses/Orbits: No acute finding. There is a chronic medial blowout fracture of the RIGHT orbit. There is a small osteoma in the LEFT anterior frontal ethmoid region Other: No layering mastoid fluid. CT CERVICAL SPINE FINDINGS Alignment: Normal. Skull base and vertebrae: No acute fracture. No primary bone lesion or focal pathologic process. Soft tissues and spinal canal: No prevertebral fluid or swelling. No visible canal hematoma. Disc levels:  No visible disc protrusion or spinal stenosis. Upper chest: No pneumothorax.  No upper rib fracture is seen. Other: None. IMPRESSION: No skull fracture or intracranial hemorrhage. No cervical spine fracture or traumatic subluxation. Electronically Signed   By: Elsie StainJohn T Curnes M.D.   On: 02/12/2016 13:49   Ct Abdomen Pelvis W Contrast  Result Date: 02/12/2016 CLINICAL DATA:  Lower abdominal pain after falling from a roof today. EXAM: CT ABDOMEN AND PELVIS WITH CONTRAST TECHNIQUE: Multidetector CT imaging of the abdomen and pelvis was performed using the standard protocol following bolus administration of intravenous contrast. CONTRAST:  100mL ISOVUE-300 IOPAMIDOL (ISOVUE-300) INJECTION 61% COMPARISON:  None. FINDINGS: Lower chest: Normal. Hepatobiliary: No focal liver abnormality is seen. No gallstones, gallbladder wall thickening, or biliary dilatation. Pancreas: Unremarkable. No pancreatic ductal dilatation or surrounding inflammatory changes. Spleen: No splenic injury or parasplenic hematoma. Adrenals/Urinary Tract: No adrenal hemorrhage or renal injury identified. Bladder is unremarkable. Stomach/Bowel: Stomach is within normal limits. Appendix appears normal. No evidence of bowel wall thickening, distention, or inflammatory changes. Vascular/Lymphatic: No significant vascular findings are present. No enlarged abdominal or pelvic lymph nodes. Reproductive: Prostate is unremarkable. Other: No free air or free fluid. No appreciable soft tissue  hematomas or contusions. Musculoskeletal: No acute or significant osseous findings. IMPRESSION: Negative CT scan of the abdomen and pelvis. Electronically Signed   By: Francene BoyersJames  Maxwell M.D.   On: 02/12/2016 13:50    Procedures Procedures (including critical care time)  Medications Ordered in ED Medications  ondansetron (ZOFRAN) 4 MG/2ML injection (not administered)  sodium chloride 0.9 % bolus 1,000 mL (not administered)    And  0.9 %  sodium chloride infusion (not administered)  morphine 4 MG/ML injection 4 mg (4 mg Intravenous Given 02/12/16 1240)  ondansetron (ZOFRAN) injection 4 mg (4 mg Intravenous Given 02/12/16 1240)  iopamidol (ISOVUE-300) 61 % injection (100 mLs  Contrast Given 02/12/16 1326)  morphine 4 MG/ML injection  4 mg (4 mg Intravenous Given 02/12/16 1414)     Initial Impression / Assessment and Plan / ED Course  I have reviewed the triage vital signs and the nursing notes.  Pertinent labs & imaging results that were available during my care of the patient were reviewed by me and considered in my medical decision making (see chart for details).  Clinical Course    Patient is a 45 year old male who presents as a level II trauma after falling off a roof approximately 12 feet with loss consciousness.  Patient has amnesia regarding the event and is mildly confused regarding the date but otherwise oriented. Patient complaining primarily of left knee pain and denies any other pain. Patient does also complain of headache but no spinal tenderness. Next  On exam head is atraumatic, C/T/L-spine is nontender to palpation. Patient is neurovascular intact in all 4 extremities. Patient has small abrasion to the left knee but otherwise appears atraumatic. Chest wall is nontender and stable. Abdomen is nontender. Fast performed with all negative use except for the pelvis which showed questionable fluid.  CT head, CT cervical, CT abdomen/pelvis, x-ray left knee obtained. All negative for  any acute findings. There is some mild edema in the left knee but no fractures. Patient ablated in the ED with crutches and will use crutches at home that he already has. Patient can follow up with orthopedics if he continues to have pain over the next week or 2.    Patient likely has a concussion based on headache and mild confusion. Patient may follow up with his PCP regarding this.   Patient is discharged in good condition.  Patient seen with attending, Dr. Fayrene FearingJames  Final Clinical Impressions(s) / ED Diagnoses   Final diagnoses:  Pain  Trauma  Concussion with loss of consciousness of 30 minutes or less, initial encounter  Contusion of left knee, initial encounter    New Prescriptions New Prescriptions   HYDROCODONE-ACETAMINOPHEN (NORCO/VICODIN) 5-325 MG TABLET    Take 2 tablets by mouth every 4 (four) hours as needed.     Dwana MelenaRobin Lougenia Morrissey, DO 02/12/16 1446    Rolland PorterMark James, MD 02/19/16 [phone]

## 2016-02-12 NOTE — ED Notes (Signed)
Patient remains in CT 

## 2016-02-12 NOTE — Progress Notes (Signed)
Responded to level two trauma.  Patient fell from tree.  Pt. Is alert and asked that I call his wife.  Wife was called and she is in route to ED. Provided emotional support.

## 2016-02-12 NOTE — Progress Notes (Signed)
Orthopedic Tech Progress Note Patient Details:  Joe Ingerry L Cornia 1970-08-15 [id number]  Patient ID: Joe Allison, male   DOB: 1970-08-15, 45 y.o.   MRN: [id number]   Joe Allison, Joe Allison 02/12/2016, 12:36 PM Made level 2 trauma visit

## 2016-02-13 ENCOUNTER — Encounter (HOSPITAL_COMMUNITY): Payer: Self-pay | Admitting: Emergency Medicine

## 2018-04-21 IMAGING — CT CT ABD-PELV W/ CM
2 of 5 series · 17 of 46 positions shown, 19 images · IV contrast (iopamidol)
Comparison: None.

CLINICAL DATA: Lower abdominal pain after falling from a roof
today.

EXAM:
CT ABDOMEN AND PELVIS WITH CONTRAST
TECHNIQUE: Multidetector CT imaging of the abdomen and pelvis was performed
using the standard protocol following bolus administration of
intravenous contrast.
CONTRAST:  100mL [id number] IOPAMIDOL ([id number]) INJECTION 61%

[Series 2: a/p w/ 5mm · axial · 0.94mm/px · z∈[-502,-72]mm · 14 of 98 slices shown, 16 images]
[im 6/98  soft-tissue]
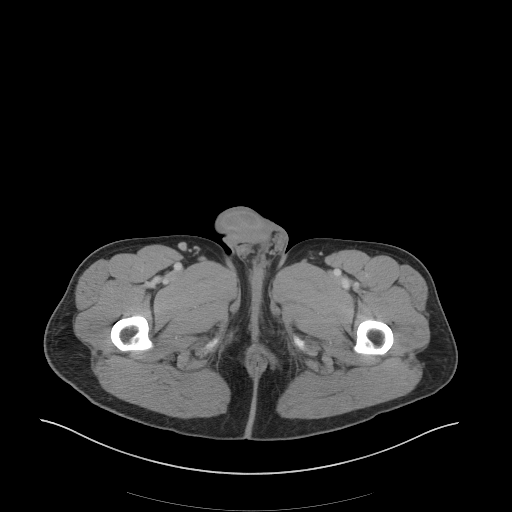
[im 6/98  bone]
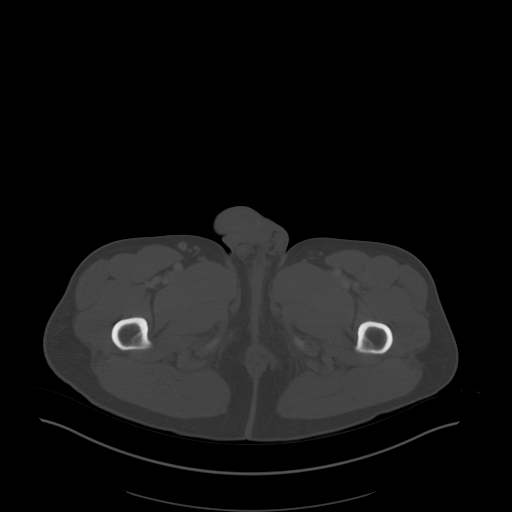
[im 11/98  soft-tissue]
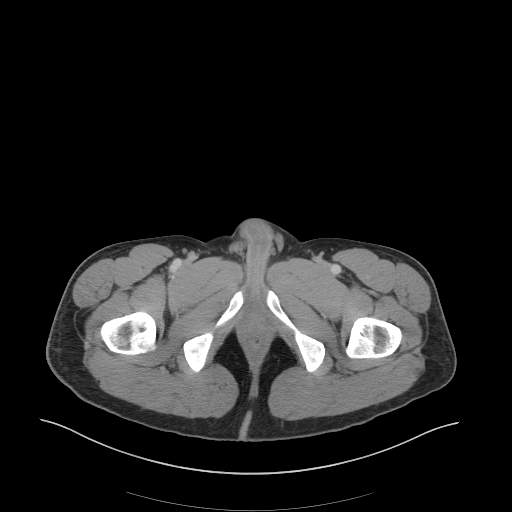
[im 22/98  soft-tissue]
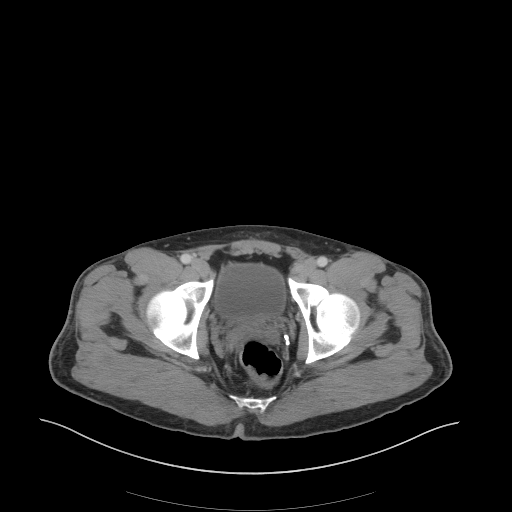
[im 27/98  soft-tissue]
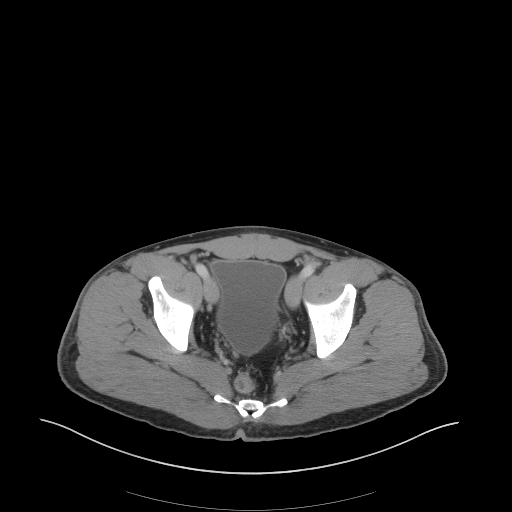
[im 33/98  soft-tissue]
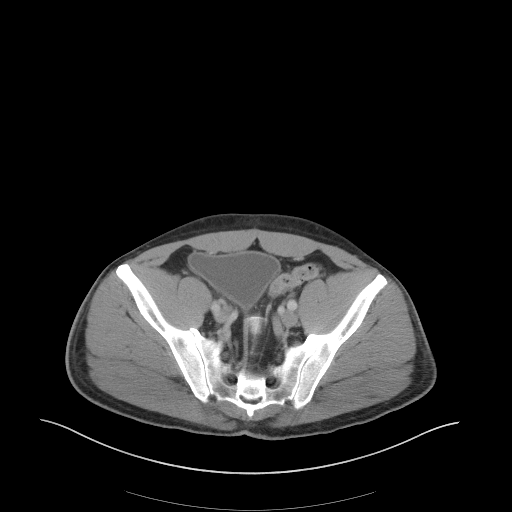
[im 38/98  soft-tissue]
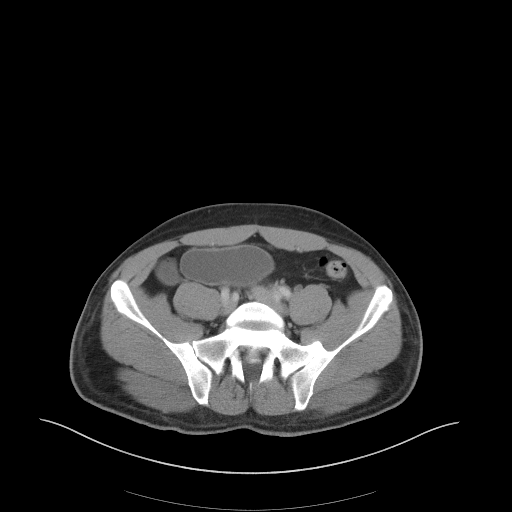
[im 44/98  soft-tissue]
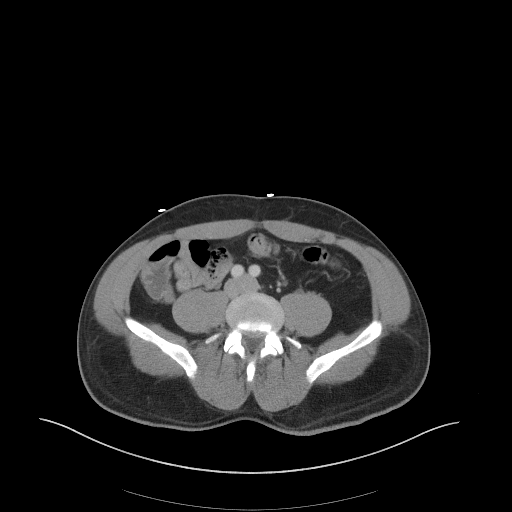
[im 54/98  soft-tissue]
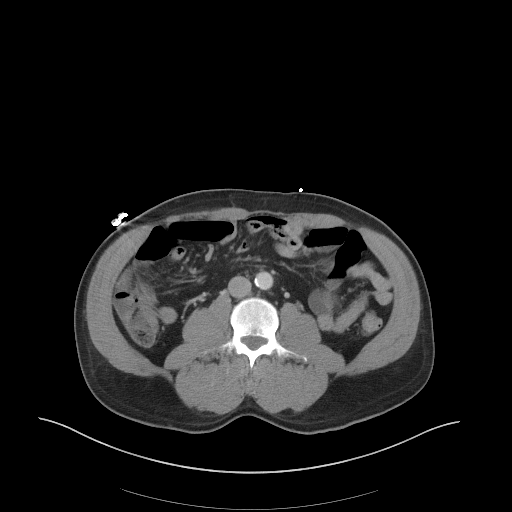
[im 60/98  soft-tissue]
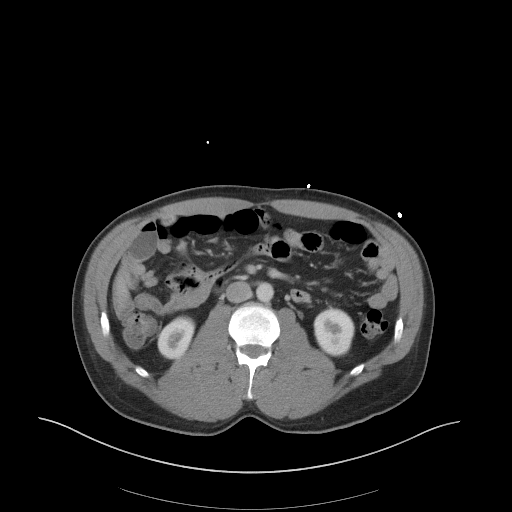
[im 60/98  bone]
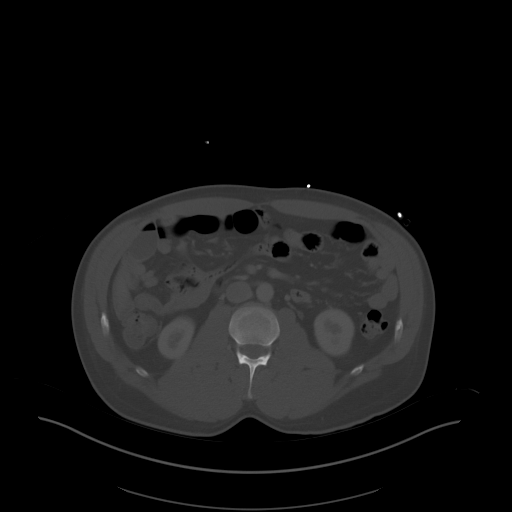
[im 65/98  soft-tissue]
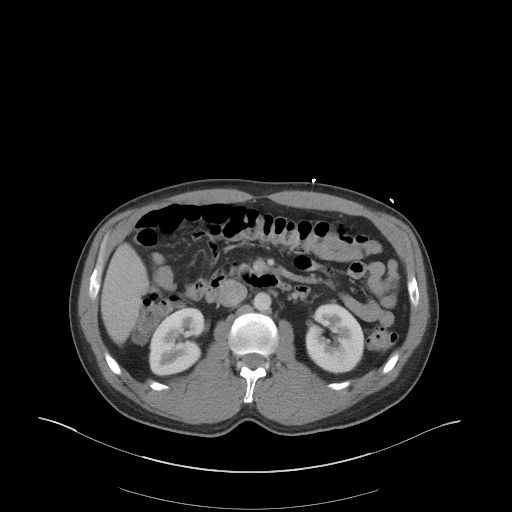
[im 71/98  soft-tissue]
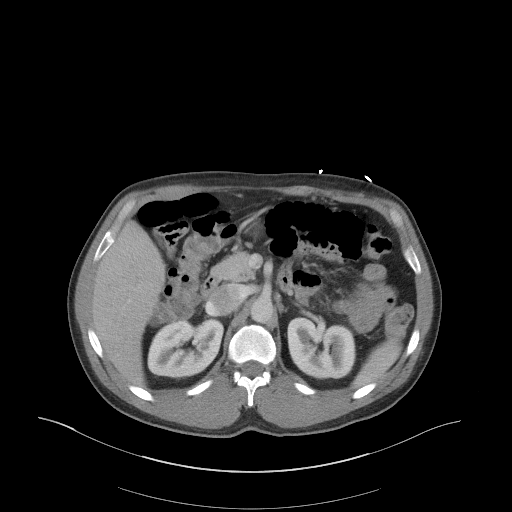
[im 76/98  soft-tissue]
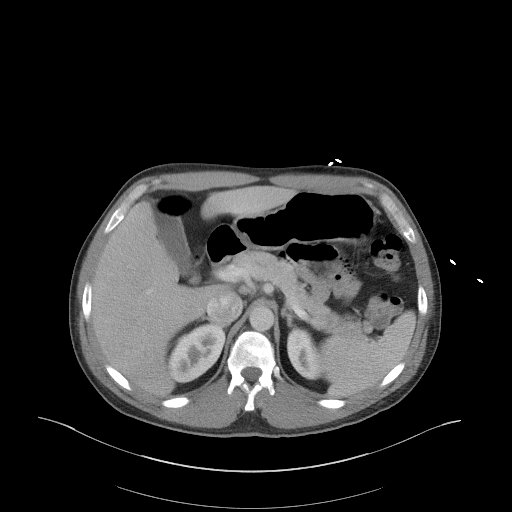
[im 87/98  soft-tissue]
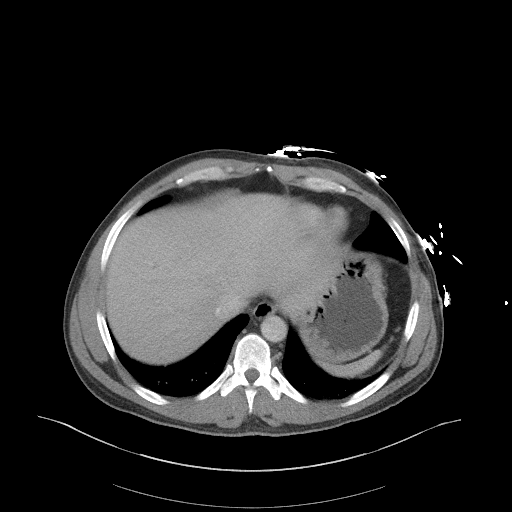
[im 92/98  soft-tissue]
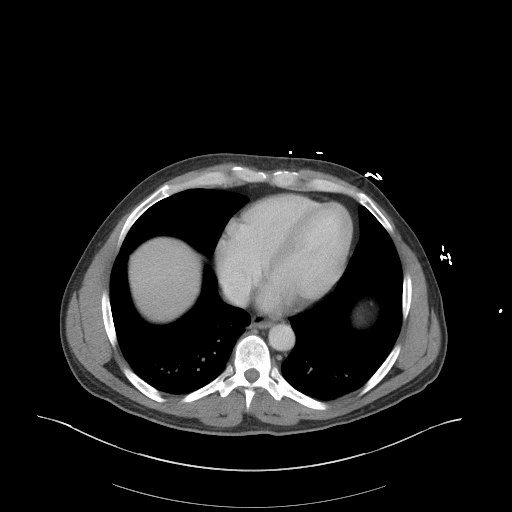

[Series 5: a/p w/ cor · coronal · 0.82mm/px · 3 of 151 slices shown]
[im 51/151  soft-tissue]
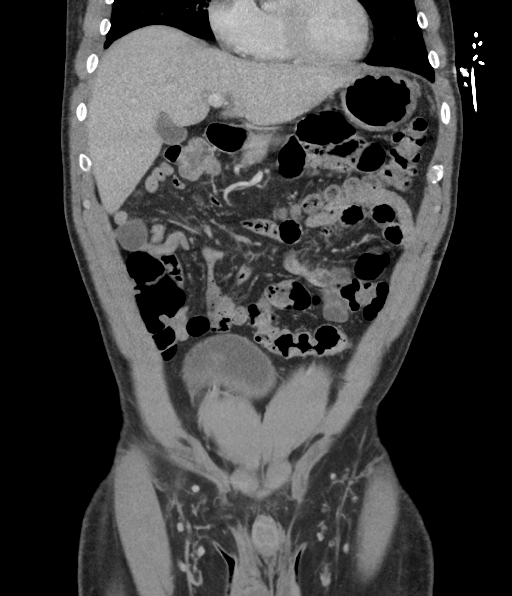
[im 67/151  soft-tissue]
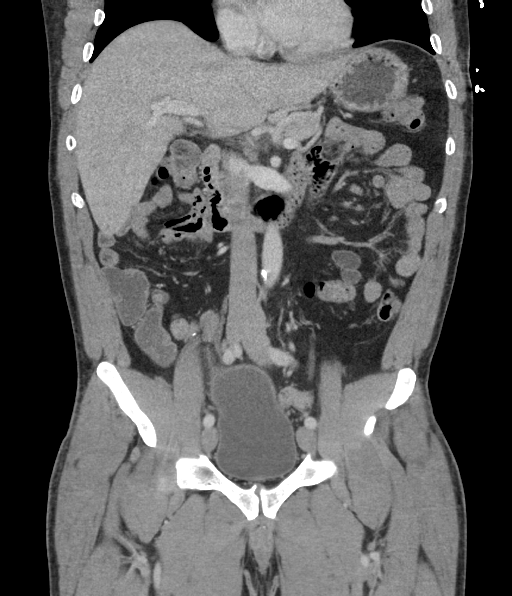
[im 84/151  soft-tissue]
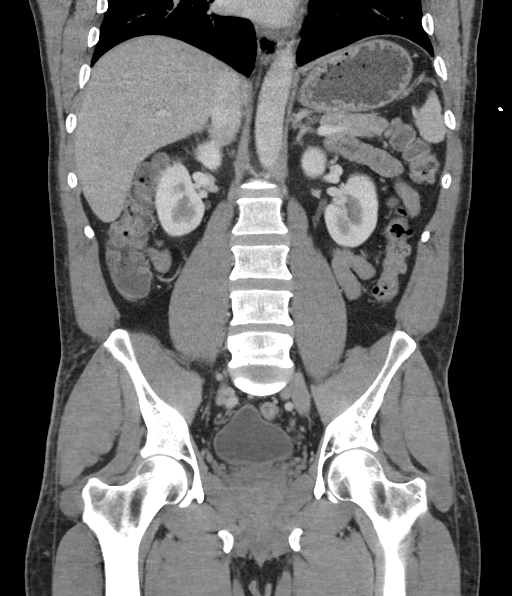

[17 of 46 positions shown; findings below may reference images not displayed]

FINDINGS: Lower chest: Normal.

Hepatobiliary: No focal liver abnormality is seen. No gallstones,
gallbladder wall thickening, or biliary dilatation.

Pancreas: Unremarkable. No pancreatic ductal dilatation or
surrounding inflammatory changes.

Spleen: No splenic injury or parasplenic hematoma.

Adrenals/Urinary Tract: No adrenal hemorrhage or renal injury
identified. Bladder is unremarkable.

Stomach/Bowel: Stomach is within normal limits. Appendix appears
normal. No evidence of bowel wall thickening, distention, or
inflammatory changes.

Vascular/Lymphatic: No significant vascular findings are present. No
enlarged abdominal or pelvic lymph nodes.

Reproductive: Prostate is unremarkable.

Other: No free air or free fluid. No appreciable soft tissue
hematomas or contusions.

Musculoskeletal: No acute or significant osseous findings.
IMPRESSION: Negative CT scan of the abdomen and pelvis.

## 2018-04-21 IMAGING — DX DG KNEE 1-2V*L*
2 series · 2 of 2 positions shown · non-contrast
Comparison: None.

CLINICAL DATA: Patient fell from tree today. CP and pain all over
left knee.

EXAM:
LEFT KNEE - 1-2 VIEW

[x knee ap left]
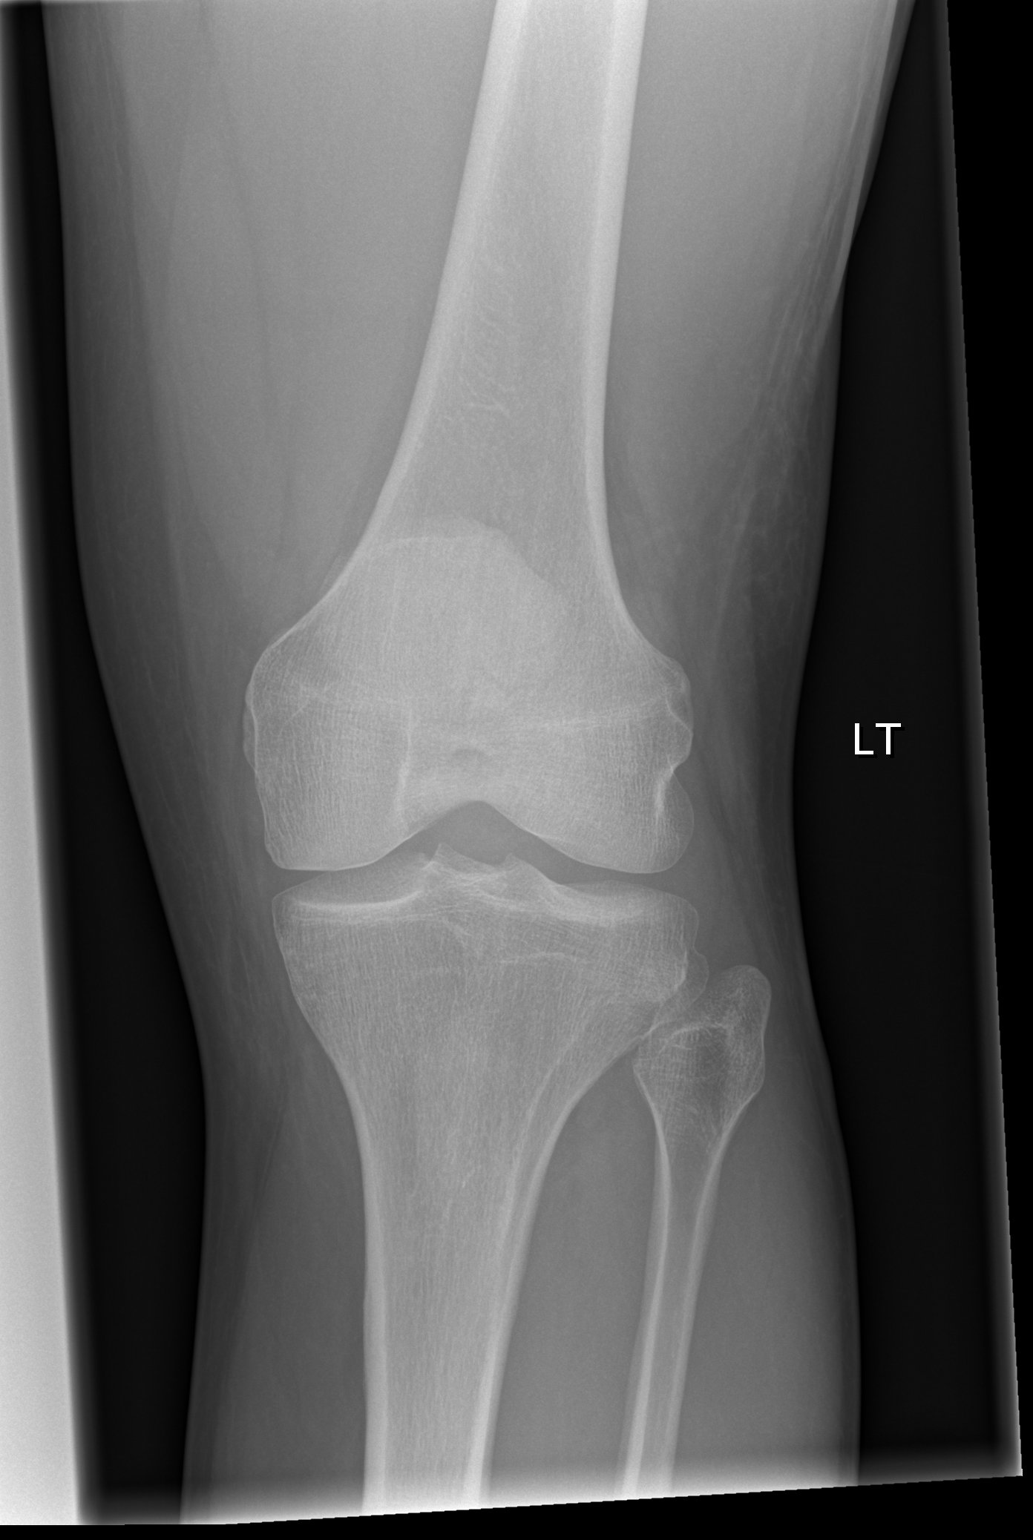

[x knee lat left]
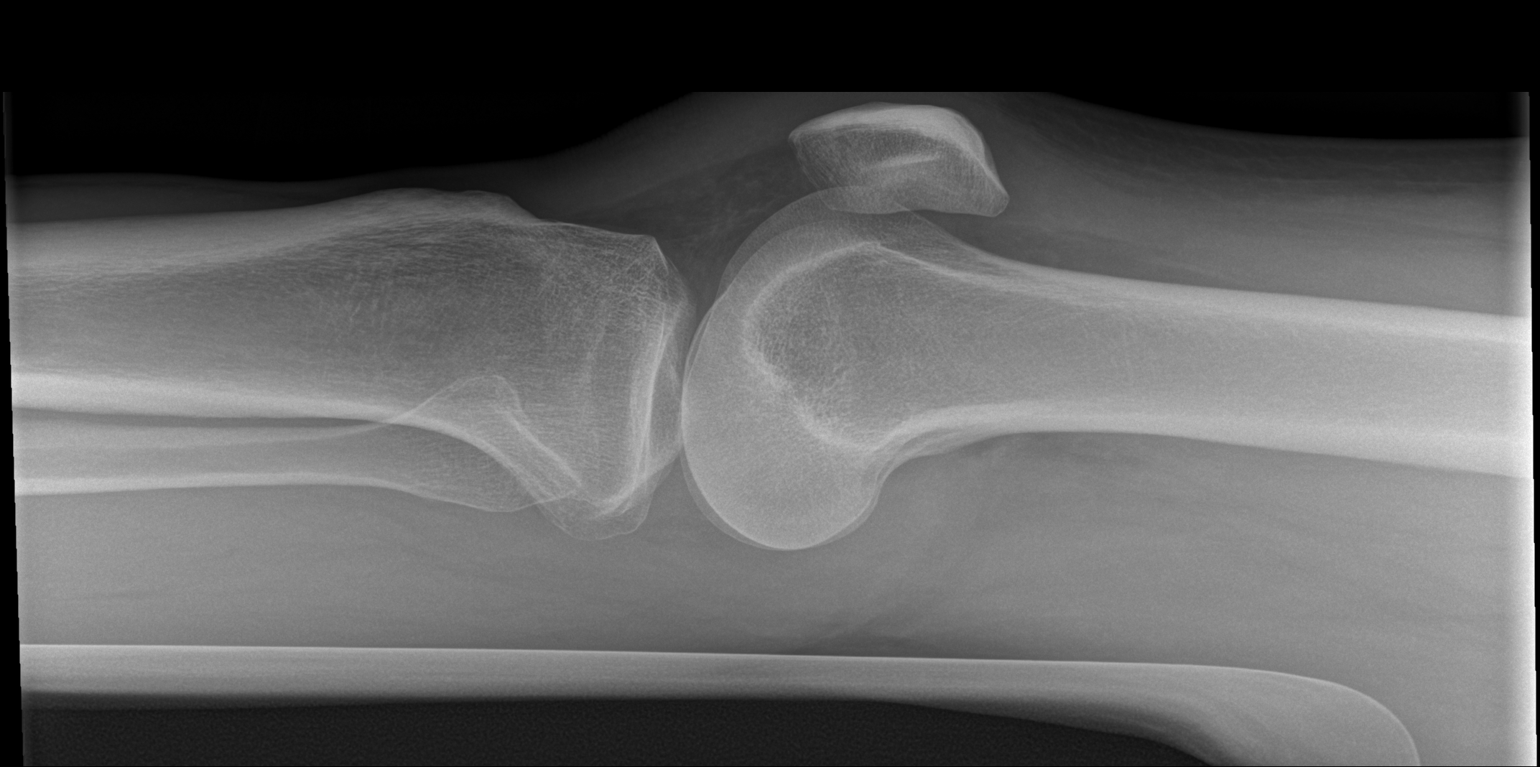

[2 of 2 positions shown; findings below may reference images not displayed]

FINDINGS: No evidence of fracture, dislocation, or joint effusion. No evidence
of arthropathy or other focal bone abnormality. Soft tissues are
unremarkable.
IMPRESSION: Negative.
